# Patient Record
Sex: Female | Born: 1995 | Race: White | Hispanic: No | Marital: Single | Smoking: Never smoker
Health system: Southern US, Community
[De-identification: ages and names within clinical notes are randomized; demographics above are authoritative.]

---
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
## 2015-02-07 ENCOUNTER — Encounter: Payer: Self-pay | Admitting: Emergency Medicine

## 2015-02-07 ENCOUNTER — Ambulatory Visit
Admission: EM | Admit: 2015-02-07 | Discharge: 2015-02-07 | Disposition: A | Discharge status: Home / Self Care | Payer: Managed Care, Other (non HMO) | Attending: Family Medicine | Admitting: Family Medicine

## 2015-02-07 DIAGNOSIS — J069 Acute upper respiratory infection, unspecified: Secondary | ICD-10-CM

## 2015-02-07 MED ORDER — AZITHROMYCIN 250 MG PO TABS: ORAL_TABLET | ORAL | Status: AC

## 2015-02-07 NOTE — Discharge Instructions (Signed)
Upper Respiratory Infection, Adult An upper respiratory infection (URI) is also sometimes known as the common cold. The upper respiratory tract includes the nose, sinuses, throat, trachea, and bronchi. Bronchi are the airways leading to the lungs. Most people improve within 1 week, but symptoms can last up to 2 weeks. A residual cough may last even longer.  CAUSES Many different viruses can infect the tissues lining the upper respiratory tract. The tissues become irritated and inflamed and often become very moist. Mucus production is also common. A cold is contagious. You can easily spread the virus to others by oral contact. This includes kissing, sharing a glass, coughing, or sneezing. Touching your mouth or nose and then touching a surface, which is then touched by another person, can also spread the virus. SYMPTOMS  Symptoms typically develop 1 to 3 days after you come in contact with a cold virus. Symptoms vary from person to person. They may include:  Runny nose.  Sneezing.  Nasal congestion.  Sinus irritation.  Sore throat.  Loss of voice (laryngitis).  Cough.  Fatigue.  Muscle aches.  Loss of appetite.  Headache.  Low-grade fever. DIAGNOSIS  You might diagnose your own cold based on familiar symptoms, since most people get a cold 2 to 3 times a year. Your caregiver can confirm this based on your exam. Most importantly, your caregiver can check that your symptoms are not due to another disease such as strep throat, sinusitis, pneumonia, asthma, or epiglottitis. Blood tests, throat tests, and X-rays are not necessary to diagnose a common cold, but they may sometimes be helpful in excluding other more serious diseases. Your caregiver will decide if any further tests are required. RISKS AND COMPLICATIONS  You may be at risk for a more severe case of the common cold if you smoke cigarettes, have chronic heart disease (such as heart failure) or lung disease (such as asthma), or if  you have a weakened immune system. The very young and very old are also at risk for more serious infections. Bacterial sinusitis, middle ear infections, and bacterial pneumonia can complicate the common cold. The common cold can worsen asthma and chronic obstructive pulmonary disease (COPD). Sometimes, these complications can require emergency medical care and may be life-threatening. PREVENTION  The best way to protect against getting a cold is to practice good hygiene. Avoid oral or hand contact with people with cold symptoms. Wash your hands often if contact occurs. There is no clear evidence that vitamin C, vitamin E, echinacea, or exercise reduces the chance of developing a cold. However, it is always recommended to get plenty of rest and practice good nutrition. TREATMENT  Treatment is directed at relieving symptoms. There is no cure. Antibiotics are not effective, because the infection is caused by a virus, not by bacteria. Treatment may include:  Increased fluid intake. Sports drinks offer valuable electrolytes, sugars, and fluids.  Breathing heated mist or steam (vaporizer or shower).  Eating chicken soup or other clear broths, and maintaining good nutrition.  Getting plenty of rest.  Using gargles or lozenges for comfort.  Controlling fevers with ibuprofen or acetaminophen as directed by your caregiver.  Increasing usage of your inhaler if you have asthma. Zinc gel and zinc lozenges, taken in the first 24 hours of the common cold, can shorten the duration and lessen the severity of symptoms. Pain medicines may help with fever, muscle aches, and throat pain. A variety of non-prescription medicines are available to treat congestion and runny nose. Your caregiver   can make recommendations and may suggest nasal or lung inhalers for other symptoms.  HOME CARE INSTRUCTIONS   Only take over-the-counter or prescription medicines for pain, discomfort, or fever as directed by your  caregiver.  Use a warm mist humidifier or inhale steam from a shower to increase air moisture. This may keep secretions moist and make it easier to breathe.  Drink enough water and fluids to keep your urine clear or pale yellow.  Rest as needed.  Return to work when your temperature has returned to normal or as your caregiver advises. You may need to stay home longer to avoid infecting others. You can also use a face mask and careful hand washing to prevent spread of the virus. SEEK MEDICAL CARE IF:   After the first few days, you feel you are getting worse rather than better.  You need your caregiver's advice about medicines to control symptoms.  You develop chills, worsening shortness of breath, or brown or red sputum. These may be signs of pneumonia.  You develop yellow or brown nasal discharge or pain in the face, especially when you bend forward. These may be signs of sinusitis.  You develop a fever, swollen neck glands, pain with swallowing, or white areas in the back of your throat. These may be signs of strep throat. SEEK IMMEDIATE MEDICAL CARE IF:   You have a fever.  You develop severe or persistent headache, ear pain, sinus pain, or chest pain.  You develop wheezing, a prolonged cough, cough up blood, or have a change in your usual mucus (if you have chronic lung disease).  You develop sore muscles or a stiff neck. Document Released: 10/23/2000 Document Revised: 07/22/2011 Document Reviewed: 08/04/2013 Exit[employer] Patient Information 2015 [employer]. This information is not intended to replace advice given to you by your health care provider. Make sure you discuss any questions you have with your health care provider.  

## 2015-02-07 NOTE — ED Notes (Signed)
Pt with uri s/s x 1 week

## 2015-02-07 NOTE — ED Provider Notes (Signed)
CSN: [id number]     Arrival date & time 02/07/15  1043 History   First MD Initiated Contact with Patient 02/07/15 1141     Chief Complaint  Patient presents with  . 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 She states that the symptoms worsened significantly  after Saturday. She did having sputum and mucus along with chills and fever. Her sinus seems be her worse problem, is likely causing her cough. She has been using NyQuil and over-the-counter medications which have not been helpful.  History reviewed. No pertinent past medical history. History reviewed. No pertinent past surgical history. History reviewed. No pertinent family history. Social History  Substance Use Topics  . Smoking status: Never Smoker   . Smokeless tobacco: None  . Alcohol Use: Yes   OB History    No data available     Review of Systems  Constitutional: Positive for fever, chills and fatigue.  HENT: Positive for congestion, postnasal drip, rhinorrhea, sinus pressure, sneezing and sore throat.   Eyes: Negative for pain, discharge and itching.  Respiratory: Positive for cough.   All other systems reviewed and are negative.   Allergies  Dye fdc red  Home Medications   Prior to Admission medications   Medication Sig Start Date End Date Taking? Authorizing Provider  norgestimate-ethinyl estradiol (ORTHO-CYCLEN,SPRINTEC,PREVIFEM) 0.25-35 MG-MCG tablet Take 1 tablet by mouth daily.   Yes Historical Provider, MD  azithromycin (ZITHROMAX Z-PAK) 250 MG tablet Use as per package instructions 02/07/15   Lutricia Feil, PA-C   Meds Ordered and Administered this Visit  Medications - No data to display  BP 126/87 mmHg  Pulse 88  Temp(Src) 97.5 F (36.4 C) (Tympanic)  Resp 20  Ht 5' 4.5" (1.638 m)  Wt 125 lb (56.7 kg)   BMI 21.13 kg/m2  SpO2 100%  LMP 01/20/2015 No data found.   Physical Exam  Constitutional: She is oriented to person, place, and time. She appears well-developed and well-nourished. No distress.  HENT:  Head: Normocephalic and atraumatic.  Right Ear: External ear normal.  Left Ear: External ear normal.  Mouth/Throat: Oropharyngeal exudate present.  Tenderness to percussion over the frontal and maxillary sinuses  Eyes: Pupils are equal, round, and reactive to light. Right eye exhibits no discharge. Left eye exhibits no discharge.  Neck: Neck supple.  Pulmonary/Chest: Effort normal and breath sounds normal. No stridor. No respiratory distress. She has no wheezes. She has no rales.  Musculoskeletal: Normal range of motion. She exhibits no edema or tenderness.  Lymphadenopathy:    She has no cervical adenopathy.  Neurological: She is alert and oriented to person, place, and time.  Skin: Skin is warm and dry. She is not diaphoretic.  Psychiatric: She has a normal mood and affect. Her behavior is normal. Judgment and thought content normal.  Nursing note and vitals reviewed.   ED Course  Procedures (including critical care time)  Labs Review Labs Reviewed - No data to display  Imaging Review No results found.   Visual Acuity Review  Right Eye Distance:   Left Eye Distance:   Bilateral Distance:    Right Eye Near:   Left Eye Near:    Bilateral Near:         MDM   1. Acute URI    Discharge Medication List as of 02/07/2015 12:03 PM    START  taking these medications   Details  azithromycin (ZITHROMAX Z-PAK) 250 MG tablet Use as per package instructions, Print      Plan: 1. Test/x-ray results and diagnosis reviewed with patient 2. rx as per orders; risks, benefits, potential side effects reviewed with patient 3. Recommend supportive treatment with rest,fluids flonase 4. F/u prn if symptoms worsen or don't improve     Lutricia Feil, PA-C 02/07/15 1208

## 2015-02-10 ENCOUNTER — Encounter: Payer: Self-pay | Admitting: Emergency Medicine

## 2015-02-10 ENCOUNTER — Ambulatory Visit
Admission: EM | Admit: 2015-02-10 | Discharge: 2015-02-10 | Disposition: A | Discharge status: Home / Self Care | Payer: Managed Care, Other (non HMO) | Attending: Family Medicine | Admitting: Family Medicine

## 2015-02-10 DIAGNOSIS — J029 Acute pharyngitis, unspecified: Secondary | ICD-10-CM | POA: Diagnosis not present

## 2015-02-10 DIAGNOSIS — H6593 Unspecified nonsuppurative otitis media, bilateral: Secondary | ICD-10-CM | POA: Diagnosis not present

## 2015-02-10 DIAGNOSIS — J019 Acute sinusitis, unspecified: Secondary | ICD-10-CM

## 2015-02-10 DIAGNOSIS — H6981 Other specified disorders of Eustachian tube, right ear: Secondary | ICD-10-CM

## 2015-02-10 DIAGNOSIS — J45990 Exercise induced bronchospasm: Secondary | ICD-10-CM

## 2015-02-10 DIAGNOSIS — H6991 Unspecified Eustachian tube disorder, right ear: Secondary | ICD-10-CM

## 2015-02-10 LAB — RAPID STREP SCREEN (MED CTR MEBANE ONLY): Streptococcus, Group A Screen (Direct): NEGATIVE

## 2015-02-10 MED ORDER — AMOXICILLIN-POT CLAVULANATE 875-125 MG PO TABS: 1.0000 | ORAL_TABLET | Freq: Two times a day (BID) | ORAL | Status: AC

## 2015-02-10 MED ORDER — FIRST-DUKES MOUTHWASH MT SUSP: 15.0000 mL | Freq: Three times a day (TID) | OROMUCOSAL | Status: AC | PRN

## 2015-02-10 MED ORDER — IBUPROFEN 800 MG PO TABS: 800.0000 mg | ORAL_TABLET | Freq: Three times a day (TID) | ORAL | Status: AC

## 2015-02-10 MED ORDER — SALINE SPRAY 0.65 % NA SOLN: 2.0000 | NASAL | Status: AC

## 2015-02-10 MED ORDER — ALBUTEROL SULFATE HFA 108 (90 BASE) MCG/ACT IN AERS
1.0000 | INHALATION_SPRAY | RESPIRATORY_TRACT | Status: AC | PRN
Start: 2015-02-10 — End: ?

## 2015-02-10 NOTE — ED Provider Notes (Addendum)
CSN: [id number]     Arrival date & time 02/10/15  4098 History   First MD Initiated Contact with Patient 02/10/15 1030     Chief Complaint  Patient presents with  . 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  Now right ear/throat pain and pain with swallowing.  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  Here with mother who is visiting for parents weekend.  Productive green cough.  Roommates also sick.  Sinus pressure has decreased.  Patient is a 19 y.o. female presenting with pharyngitis. The history is provided by the patient and a parent.  Sore Throat This is a new problem. The current episode started 2 days ago. The problem occurs constantly. The problem has been gradually worsening. Pertinent negatives include no chest pain, no abdominal pain, no headaches and no shortness of breath. The symptoms are aggravated by eating, swallowing, drinking and coughing. Nothing relieves the symptoms. She has tried rest, water and food for the symptoms. The treatment provided no relief.    History reviewed. No pertinent past medical history. History reviewed. No pertinent past surgical history. History reviewed. No pertinent family history. Social History  Substance Use Topics  . Smoking status: Never Smoker   . Smokeless tobacco: None  . Alcohol Use: Yes   OB History    No data available     Review of Systems  Constitutional: Positive for fever, chills, activity change, appetite change and fatigue. Negative for diaphoresis and unexpected weight change.  HENT: Positive for congestion, ear pain, postnasal drip, sinus pressure and sore throat. Negative for dental problem, drooling, ear discharge, facial swelling, hearing loss, mouth sores, nosebleeds, rhinorrhea, sneezing, tinnitus, trouble  swallowing and voice change.   Eyes: Negative for photophobia, pain, discharge, redness, itching and visual disturbance.  Respiratory: Positive for cough. Negative for choking, chest tightness, shortness of breath, wheezing and stridor.   Cardiovascular: Negative for chest pain, palpitations and leg swelling.  Gastrointestinal: Negative for nausea, vomiting, abdominal pain, diarrhea, constipation, blood in stool and abdominal distention.  Endocrine: Positive for cold intolerance. Negative for heat intolerance.  Genitourinary: Negative for dysuria.  Musculoskeletal: Negative for myalgias, back pain, joint swelling, arthralgias, gait problem, neck pain and neck stiffness.  Skin: Negative for color change, pallor, rash and wound.  Allergic/Immunologic: Positive for environmental allergies. Negative for food allergies.  Neurological: Negative for dizziness, tremors, seizures, syncope, facial asymmetry, speech difficulty, weakness, light-headedness, numbness and headaches.  Hematological: Negative for adenopathy. Does not bruise/bleed easily.  Psychiatric/Behavioral: Positive for sleep disturbance. Negative for behavioral problems, confusion and agitation.    Allergies  Dye fdc red  Home Medications   Prior to Admission medications   Medication Sig Start Date End Date Taking? Authorizing Provider  albuterol (PROVENTIL HFA;VENTOLIN HFA) 108 (90 BASE) MCG/ACT inhaler Inhale 1-2 puffs into the lungs every 4 (four) hours as needed for wheezing or shortness of breath. 02/10/15   Barbaraann Barthel, NP  amoxicillin-clavulanate (AUGMENTIN) 875-125 MG tablet Take 1 tablet by mouth every 12 (twelve) hours. 02/10/15   Barbaraann Barthel, NP  azithromycin (ZITHROMAX Z-PAK) 250 MG tablet Use as per package instructions 02/07/15   Lutricia Feil, PA-C  Diphenhyd-Hydrocort-Nystatin (FIRST-DUKES MOUTHWASH) SUSP Use as directed 15 mLs in the mouth or throat 3 (three) times daily as needed. 02/10/15   Barbaraann Barthel, NP  ibuprofen (ADVIL,MOTRIN) 800 MG tablet Take 1  tablet (800 mg total) by mouth 3 (three) times daily. 02/10/15   Barbaraann Barthel, NP  norgestimate-ethinyl estradiol (ORTHO-CYCLEN,SPRINTEC,PREVIFEM) 0.25-35 MG-MCG tablet Take 1 tablet by mouth daily.    Historical Provider, MD  sodium chloride (OCEAN) 0.65 % SOLN nasal spray Place 2 sprays into both nostrils every 2 (two) hours while awake. 02/10/15   Barbaraann Barthel, NP   Meds Ordered and Administered this Visit  Medications - No data to display  BP 125/85 mmHg  Pulse 95  Temp(Src) 100.2 F (37.9 C) (Tympanic)  Resp 16  Ht  (1.6 m)  Wt 120 lb (54.432 kg)  BMI 21.26 kg/m2  SpO2 97%  LMP 01/20/2015 No data found.   Physical Exam  Constitutional: She is oriented to person, place, and time. Vital signs are normal. She appears well-developed and well-nourished. No distress.  HENT:  Head: Normocephalic and atraumatic.  Right Ear: Hearing, external ear and ear canal normal. A middle ear effusion is present.  Left Ear: Hearing, external ear and ear canal normal. A middle ear effusion is present.  Nose: Mucosal edema and rhinorrhea present. No nose lacerations, sinus tenderness, nasal deformity, septal deviation or nasal septal hematoma. No epistaxis.  No foreign bodies. Right sinus exhibits no maxillary sinus tenderness and no frontal sinus tenderness. Left sinus exhibits no maxillary sinus tenderness and no frontal sinus tenderness.  Mouth/Throat: Uvula is midline and mucous membranes are normal. Mucous membranes are not pale, not dry and not cyanotic. She does not have dentures. No oral lesions. No trismus in the jaw. Normal dentition. No dental abscesses, uvula swelling, lacerations or dental caries. Posterior oropharyngeal edema and posterior oropharyngeal erythema present. No oropharyngeal exudate or tonsillar abscesses.  Cobblestoning posterior pharynx; bilateral TMs with air fluid level right greater than left slight  opacity right  Eyes: Conjunctivae, EOM and lids are normal. Pupils are equal, round, and reactive to light. Right eye exhibits no discharge. Left eye exhibits no discharge. No scleral icterus.  Neck: Trachea normal and normal range of motion. Neck supple. No tracheal deviation present. No thyromegaly present.  Cardiovascular: Normal rate, regular rhythm, normal heart sounds and intact distal pulses.  Exam reveals no gallop and no friction rub.   No murmur heard. Pulmonary/Chest: Effort normal. No accessory muscle usage or stridor. No respiratory distress. She has no decreased breath sounds. She has wheezes in the right upper field and the left upper field. She has no rhonchi. She has no rales. She exhibits no tenderness.  Abdominal: Soft. Bowel sounds are normal. She exhibits no distension and no mass. There is no tenderness. There is no rebound and no guarding.  Musculoskeletal: Normal range of motion. She exhibits no edema or tenderness.  Lymphadenopathy:    She has no cervical adenopathy.  Neurological: She is alert and oriented to person, place, and time. She exhibits normal muscle tone. Coordination normal.  Skin: Skin is warm, dry and intact. No rash noted. She is not diaphoretic. No erythema. No pallor.  Psychiatric: She has a normal mood and affect. Her speech is normal and behavior is normal. Judgment and thought content normal. Cognition and memory are normal.  Nursing note and vitals reviewed.   ED Course  Procedures (including critical care time)  Labs Review Labs Reviewed  RAPID STREP SCREEN (NOT AT [facility])    Imaging Review No results found.  1211 Discussed rapid strep negative and throat culture pending.  Probable sore throat from sinusitis post nasal drip will not respond to antibiotics  supportive therapy recommended e.g. Flonase, motrin, salt water gargles, honey with lemon.  Patient and mother verbalized understanding of information/instructions and agreed with plan of  care.   MDM   1. Acute pharyngitis, unspecified pharyngitis type   2. Eustachian tube dysfunction, right   3. Otitis media with effusion, bilateral   4. Acute sinusitis, recurrence not specified, unspecified location   5. Exercise-induced asthma with acute exacerbation   School/work excuse note given to patient for 24 hours.  Rapid strep negative.  Will call with throat culture once results available typically 48 hours.  Paper Rx for augmentin  po BID x 10 days given to patient if throat culture positive or worsening sinus pain/pressure to start.  Dukes mouthwash 15ml po gargle and swallow TID prn pain.  OTC NSAID of choic e.g. Motrin  po TID prn.  Usually no specific medical treatment is needed if a virus is causing the sore throat.  The throat most often gets better on its own within 5 to 7 days.  Antibiotic medicine does not cure viral pharyngitis.   For acute pharyngitis caused by bacteria, your healthcare provider will prescribe an antibiotic.  Marland Kitchen Do not smoke.  Marland Kitchen Avoid secondhand smoke and other air pollutants.  . Use a cool mist humidifier to add moisture to the air.  . Get plenty of rest.  . You may want to rest your throat by talking less and eating a diet that is mostly liquid or soft for a day or two.   Marland Kitchen Nonprescription throat lozenges and mouthwashes should help relieve the soreness.   . Gargling with warm saltwater and drinking warm liquids may help.  (You can make a saltwater solution by adding 1/4 teaspoon of salt to 8 ounces, or 240 mL, of warm water.)  . A nonprescription pain reliever such as aspirin, acetaminophen, or ibuprofen may ease general aches and pains.   FOLLOW UP with clinic provider if no improvements in the next 7-10 days.  Patient verbalized understanding of instructions and agreed with plan of care. P2:  Hand washing and diet.  Supportive treatment.   No evidence of invasive bacterial infection, non toxic and well hydrated.  This is most likely self  limiting viral infection.  I do not see where any further testing or imaging is necessary at this time.   I will suggest supportive care, rest, good hygiene and encourage the patient to take adequate fluids.  The patient is to return to clinic or EMERGENCY ROOM if symptoms worsen or change significantly e.g. ear pain, fever, purulent discharge from ears or bleeding.  Exit[employer] handout on otitis media with effusion given to patient.  Patient verbalized agreement and understanding of treatment plan.    Does not feel like when she needs steroids right now.  If worsening to call me and will give her 5 day Rx for prednisone  po daily x 5 days.  Medications as directed.  Patient is to return to the clinic or follow up with PCM if there is increased wheezing or shortness of breath, increased use of albuterol.  Patient educated on the importance of continuing to monitor their peak flows.  Patient given [employer] handout on asthma exacerbation prevention.  Patient verbalized agreement and understanding of treatment plan.  P2:  Asthma action plan, fluids, and fitness  Continue flonase 2 sprays each nostril daily.  Add nasal saline 2 sprays each nostril q2h while awake prn congestion.  Finish azithromycin.  Given augmentin  po BID x 10  days paper Rx in case worsening symptoms to start new Rx.  Discussed use back up birth control while on antibiotics as may counteract birth control  Patient denied sexual activity.  No evidence of systemic bacterial infection, non toxic and well hydrated.  I do not see where any further testing or imaging is necessary at this time.   I will suggest supportive care, rest, good hygiene and encourage the patient to take adequate fluids.  The patient is to return to clinic or EMERGENCY ROOM if symptoms worsen or change significantly.  Exit[employer] handout on sinusitis given to patient.  Patient verbalized agreement and understanding of treatment plan and had no further questions at this time.    P2:  Hand washing and cover cough    Barbaraann Barthel, NP 02/10/15 1308  Telephone message left for patient throat culture normal/negative for strep.  Requested patient to contact clinic for symptoms/status update.  13 Feb 2015 at 1444  Barbaraann Barthel, NP 02/13/15 1444

## 2015-02-10 NOTE — Discharge Instructions (Signed)
Asthma Attack Prevention Although there is no way to prevent asthma from starting, you can take steps to control the disease and reduce its symptoms. Learn about your asthma and how to control it. Take an active role to control your asthma by working with your health care provider to create and follow an asthma action plan. An asthma action plan guides you in:  Taking your medicines properly.  Avoiding things that set off your asthma or make your asthma worse (asthma triggers).  Tracking your level of asthma control.  Responding to worsening asthma.  Seeking emergency care when needed. To track your asthma, keep records of your symptoms, check your peak flow number using a handheld device that shows how well air moves out of your lungs (peak flow meter), and get regular asthma checkups.  WHAT ARE SOME WAYS TO PREVENT AN ASTHMA ATTACK?  Take medicines as directed by your health care provider.  Keep track of your asthma symptoms and level of control.  With your health care provider, write a detailed plan for taking medicines and managing an asthma attack. Then be sure to follow your action plan. Asthma is an ongoing condition that needs regular monitoring and treatment.  Identify and avoid asthma triggers. Many outdoor allergens and irritants (such as pollen, mold, cold air, and air pollution) can trigger asthma attacks. Find out what your asthma triggers are and take steps to avoid them.  Monitor your breathing. Learn to recognize warning signs of an attack, such as coughing, wheezing, or shortness of breath. Your lung function may decrease before you notice any signs or symptoms, so regularly measure and record your peak airflow with a home peak flow meter.  Identify and treat attacks early. If you act quickly, you are less likely to have a severe attack. You will also need less medicine to control your symptoms. When your peak flow measurements decrease and alert you to an upcoming attack,  take your medicine as instructed and immediately stop any activity that may have triggered the attack. If your symptoms do not improve, get medical help.  Pay attention to increasing quick-relief inhaler use. If you find yourself relying on your quick-relief inhaler, your asthma is not under control. See your health care provider about adjusting your treatment. WHAT CAN MAKE MY SYMPTOMS WORSE? A number of common things can set off or make your asthma symptoms worse and cause temporary increased inflammation of your airways. Keep track of your asthma symptoms for several weeks, detailing all the environmental and emotional factors that are linked with your asthma. When you have an asthma attack, go back to your asthma diary to see which factor, or combination of factors, might have contributed to it. Once you know what these factors are, you can take steps to control many of them. If you have allergies and asthma, it is important to take asthma prevention steps at home. Minimizing contact with the substance to which you are allergic will help prevent an asthma attack. Some triggers and ways to avoid these triggers are: Animal Dander:  Some people are allergic to the flakes of skin or dried saliva from animals with fur or feathers.   There is no such thing as a hypoallergenic dog or cat breed. All dogs or cats can cause allergies, even if they don't shed.  Keep these pets out of your home.  If you are not able to keep a pet outdoors, keep the pet out of your bedroom and other sleeping areas at all  times, and keep the door closed. °· Remove carpets and furniture covered with cloth from your home. If that is not possible, keep the pet away from fabric-covered furniture and carpets. °Dust Mites: °Many people with asthma are allergic to dust mites. Dust mites are tiny bugs that are found in every home in mattresses, pillows, carpets, fabric-covered furniture, bedcovers, clothes, stuffed toys, and other  fabric-covered items.  °· Cover your mattress in a special dust-proof cover. °· Cover your pillow in a special dust-proof cover, or wash the pillow each week in hot water. Water must be hotter than 130° F (54.4° C) to kill dust mites. Cold or warm water used with detergent and bleach can also be effective. °· Wash the sheets and blankets on your bed each week in hot water. °· Try not to sleep or lie on cloth-covered cushions. °· Call ahead when traveling and ask for a smoke-free hotel room. Bring your own bedding and pillows in case the hotel only supplies feather pillows and down comforters, which may contain dust mites and cause asthma symptoms. °· Remove carpets from your bedroom and those laid on concrete, if you can. °· Keep stuffed toys out of the bed, or wash the toys weekly in hot water or cooler water with detergent and bleach. °Cockroaches: °Many people with asthma are allergic to the droppings and remains of cockroaches.  °· Keep food and garbage in closed containers. Never leave food out. °· Use poison baits, traps, powders, gels, or paste (for example, boric acid). °· If a spray is used to kill cockroaches, stay out of the room until the odor goes away. °Indoor Mold: °· Fix leaky faucets, pipes, or other sources of water that have mold around them. °· Clean floors and moldy surfaces with a fungicide or diluted bleach. °· Avoid using humidifiers, vaporizers, or swamp coolers. These can spread molds through the air. °Pollen and Outdoor Mold: °· When pollen or mold spore counts are high, try to keep your windows closed. °· Stay indoors with windows closed from late morning to afternoon. Pollen and some mold spore counts are highest at that time. °· Ask your health care provider whether you need to take anti-inflammatory medicine or increase your dose of the medicine before your allergy season starts. °Other Irritants to Avoid: °· Tobacco smoke is an irritant. If you smoke, ask your health care provider how  you can quit. Ask family members to quit smoking, too. Do not allow smoking in your home or car. °· If possible, do not use a wood-burning stove, kerosene heater, or fireplace. Minimize exposure to all sources of smoke, including incense, candles, fires, and fireworks. °· Try to stay away from strong odors and sprays, such as perfume, talcum powder, hair spray, and paints. °· Decrease humidity in your home and use an indoor air cleaning device. Reduce indoor humidity to below 60%. Dehumidifiers or central air conditioners can do this. °· Decrease house dust exposure by changing furnace and air cooler filters frequently. °· Try to have someone else vacuum for you once or twice a week. Stay out of rooms while they are being vacuumed and for a short while afterward. °· If you vacuum, use a dust mask from a hardware store, a double-layered or microfilter vacuum cleaner bag, or a vacuum cleaner with a HEPA filter. °· Sulfites in foods and beverages can be irritants. Do not drink beer or wine or eat dried fruit, processed potatoes, or shrimp if they cause asthma symptoms. °· Cold   air can trigger an asthma attack. Cover your nose and mouth with a scarf on cold or windy days.  Several health conditions can make asthma more difficult to manage, including a runny nose, sinus infections, reflux disease, psychological stress, and sleep apnea. Work with your health care provider to manage these conditions.  Avoid close contact with people who have a respiratory infection such as a cold or the flu, since your asthma symptoms may get worse if you catch the infection. Wash your hands thoroughly after touching items that may have been handled by people with a respiratory infection.  Get a flu shot every year to protect against the flu virus, which often makes asthma worse for days or weeks. Also get a pneumonia shot if you have not previously had one. Unlike the flu shot, the pneumonia shot does not need to be given  yearly. Medicines:  Talk to your health care provider about whether it is safe for you to take aspirin or non-steroidal anti-inflammatory medicines (NSAIDs). In a small number of people with asthma, aspirin and NSAIDs can cause asthma attacks. These medicines must be avoided by people who have known aspirin-sensitive asthma. It is important that people with aspirin-sensitive asthma read labels of all over-the-counter medicines used to treat pain, colds, coughs, and fever.  Beta-blockers and ACE inhibitors are other medicines you should discuss with your health care provider. HOW CAN I FIND OUT WHAT I AM ALLERGIC TO? Ask your asthma health care provider about allergy skin testing or blood testing (the RAST test) to identify the allergens to which you are sensitive. If you are found to have allergies, the most important thing to do is to try to avoid exposure to any allergens that you are sensitive to as much as possible. Other treatments for allergies, such as medicines and allergy shots (immunotherapy) are available.  CAN I EXERCISE? Follow your health care provider's advice regarding asthma treatment before exercising. It is important to maintain a regular exercise program, but vigorous exercise or exercise in cold, humid, or dry environments can cause asthma attacks, especially for those people who have exercise-induced asthma. Document Released: 04/17/2009 Document Revised: 05/04/2013 Document Reviewed: 11/04/2012 [facility] Patient Information 2015 [place], [place]. This information is not intended to replace advice given to you by your health care provider. Make sure you discuss any questions you have with your health care provider. Barotitis Media Barotitis media is inflammation of your middle ear. This occurs when the auditory tube (eustachian tube) leading from the back of your nose (nasopharynx) to your eardrum is blocked. This blockage may result from a cold, environmental allergies, or an upper  respiratory infection. Unresolved barotitis media may lead to damage or hearing loss (barotrauma), which may become permanent. HOME CARE INSTRUCTIONS   Use medicines as recommended by your health care provider. Over-the-counter medicines will help unblock the canal and can help during times of air travel.  Do not put anything into your ears to clean or unplug them. Eardrops will not be helpful.  Do not swim, dive, or fly until your health care provider says it is all right to do so. If these activities are necessary, chewing gum with frequent, forceful swallowing may help. It is also helpful to hold your nose and gently blow to pop your ears for equalizing pressure changes. This forces air into the eustachian tube.  Only take over-the-counter or prescription medicines for pain, discomfort, or fever as directed by your health care provider.  A decongestant may be helpful  in decongesting the middle ear and make pressure equalization easier. SEEK MEDICAL CARE IF:  You experience a serious form of dizziness in which you feel as if the room is spinning and you feel nauseated (vertigo).  Your symptoms only involve one ear. SEEK IMMEDIATE MEDICAL CARE IF:   You develop a severe headache, dizziness, or severe ear pain.  You have bloody or pus-like drainage from your ears.  You develop a fever.  Your problems do not improve or become worse. MAKE SURE YOU:   Understand these instructions.  Will watch your condition.  Will get help right away if you are not doing well or get worse. Document Released: 04/26/2000 Document Revised: 02/17/2013 Document Reviewed: 11/24/2012 [facility] Patient Information 2015 [place], [place]. This information is not intended to replace advice given to you by your health care provider. Make sure you discuss any questions you have with your health care provider. Otitis Media With Effusion Otitis media with effusion is the presence of fluid in the middle ear. This is  a common problem in children, which often follows ear infections. It may be present for weeks or longer after the infection. Unlike an acute ear infection, otitis media with effusion refers only to fluid behind the ear drum and not infection. Children with repeated ear and sinus infections and allergy problems are the most likely to get otitis media with effusion. CAUSES  The most frequent cause of the fluid buildup is dysfunction of the eustachian tubes. These are the tubes that drain fluid in the ears to the back of the nose (nasopharynx). SYMPTOMS   The main symptom of this condition is hearing loss. As a result, you or your child may:  Listen to the TV at a loud volume.  Not respond to questions.  Ask "what" often when spoken to.  Mistake or confuse one sound or word for another.  There may be a sensation of fullness or pressure but usually not pain. DIAGNOSIS   Your health care provider will diagnose this condition by examining you or your child's ears.  Your health care provider may test the pressure in you or your child's ear with a tympanometer.  A hearing test may be conducted if the problem persists. TREATMENT   Treatment depends on the duration and the effects of the effusion.  Antibiotics, decongestants, nose drops, and cortisone-type drugs (tablets or nasal spray) may not be helpful.  Children with persistent ear effusions may have delayed language or behavioral problems. Children at risk for developmental delays in hearing, learning, and speech may require referral to a specialist earlier than children not at risk.  You or your child's health care provider may suggest a referral to an ear, nose, and throat surgeon for treatment. The following may help restore normal hearing:  Drainage of fluid.  Placement of ear tubes (tympanostomy tubes).  Removal of adenoids (adenoidectomy). HOME CARE INSTRUCTIONS   Avoid secondhand smoke.  Infants who are breastfed are less  likely to have this condition.  Avoid feeding infants while they are lying flat.  Avoid known environmental allergens.  Avoid people who are sick. SEEK MEDICAL CARE IF:   Hearing is not better in 3 months.  Hearing is worse.  Ear pain.  Drainage from the ear.  Dizziness. MAKE SURE YOU:   Understand these instructions.  Will watch your condition.  Will get help right away if you are not doing well or get worse. Document Released: 06/06/2004 Document Revised: 09/13/2013 Document Reviewed: 11/24/2012 [employer] Patient  Information 2015 [place], [place]. This information is not intended to replace advice given to you by your health care provider. Make sure you discuss any questions you have with your health care provider. Pharyngitis Pharyngitis is redness, pain, and swelling (inflammation) of your pharynx.  CAUSES  Pharyngitis is usually caused by infection. Most of the time, these infections are from viruses (viral) and are part of a cold. However, sometimes pharyngitis is caused by bacteria (bacterial). Pharyngitis can also be caused by allergies. Viral pharyngitis may be spread from person to person by coughing, sneezing, and personal items or utensils (cups, forks, spoons, toothbrushes). Bacterial pharyngitis may be spread from person to person by more intimate contact, such as kissing.  SIGNS AND SYMPTOMS  Symptoms of pharyngitis include:   Sore throat.   Tiredness (fatigue).   Low-grade fever.   Headache.  Joint pain and muscle aches.  Skin rashes.  Swollen lymph nodes.  Plaque-like film on throat or tonsils (often seen with bacterial pharyngitis). DIAGNOSIS  Your health care provider will ask you questions about your illness and your symptoms. Your medical history, along with a physical exam, is often all that is needed to diagnose pharyngitis. Sometimes, a rapid strep test is done. Other lab tests may also be done, depending on the suspected cause.  TREATMENT    Viral pharyngitis will usually get better in 3-4 days without the use of medicine. Bacterial pharyngitis is treated with medicines that kill germs (antibiotics).  HOME CARE INSTRUCTIONS   Drink enough water and fluids to keep your urine clear or pale yellow.   Only take over-the-counter or prescription medicines as directed by your health care provider:   If you are prescribed antibiotics, make sure you finish them even if you start to feel better.   Do not take aspirin.   Get lots of rest.   Gargle with 8 oz of salt water ( tsp of salt per 1 qt of water) as often as every 1-2 hours to soothe your throat.   Throat lozenges (if you are not at risk for choking) or sprays may be used to soothe your throat. SEEK MEDICAL CARE IF:   You have large, tender lumps in your neck.  You have a rash.  You cough up green, yellow-brown, or bloody spit. SEEK IMMEDIATE MEDICAL CARE IF:   Your neck becomes stiff.  You drool or are unable to swallow liquids.  You vomit or are unable to keep medicines or liquids down.  You have severe pain that does not go away with the use of recommended medicines.  You have trouble breathing (not caused by a stuffy nose). MAKE SURE YOU:   Understand these instructions.  Will watch your condition.  Will get help right away if you are not doing well or get worse. Document Released: 04/29/2005 Document Revised: 02/17/2013 Document Reviewed: 01/04/2013 [facility] Patient Information 2015 [place], [place]. This information is not intended to replace advice given to you by your health care provider. Make sure you discuss any questions you have with your health care provider. Sinusitis Sinusitis is redness, soreness, and inflammation of the paranasal sinuses. Paranasal sinuses are air pockets within the bones of your face (beneath the eyes, the middle of the forehead, or above the eyes). In healthy paranasal sinuses, mucus is able to drain out, and air is  able to circulate through them by way of your nose. However, when your paranasal sinuses are inflamed, mucus and air can become trapped. This can allow bacteria and  other germs to grow and cause infection. Sinusitis can develop quickly and last only a short time (acute) or continue over a long period (chronic). Sinusitis that lasts for more than 12 weeks is considered chronic.  CAUSES  Causes of sinusitis include:  Allergies.  Structural abnormalities, such as displacement of the cartilage that separates your nostrils (deviated septum), which can decrease the air flow through your nose and sinuses and affect sinus drainage.  Functional abnormalities, such as when the small hairs (cilia) that line your sinuses and help remove mucus do not work properly or are not present. SIGNS AND SYMPTOMS  Symptoms of acute and chronic sinusitis are the same. The primary symptoms are pain and pressure around the affected sinuses. Other symptoms include:  Upper toothache.  Earache.  Headache.  Bad breath.  Decreased sense of smell and taste.  A cough, which worsens when you are lying flat.  Fatigue.  Fever.  Thick drainage from your nose, which often is green and may contain pus (purulent).  Swelling and warmth over the affected sinuses. DIAGNOSIS  Your health care provider will perform a physical exam. During the exam, your health care provider may:  Look in your nose for signs of abnormal growths in your nostrils (nasal polyps).  Tap over the affected sinus to check for signs of infection.  View the inside of your sinuses (endoscopy) using an imaging device that has a light attached (endoscope). If your health care provider suspects that you have chronic sinusitis, one or more of the following tests may be recommended:  Allergy tests.  Nasal culture. A sample of mucus is taken from your nose, sent to a lab, and screened for bacteria.  Nasal cytology. A sample of mucus is taken from  your nose and examined by your health care provider to determine if your sinusitis is related to an allergy. TREATMENT  Most cases of acute sinusitis are related to a viral infection and will resolve on their own within 10 days. Sometimes medicines are prescribed to help relieve symptoms (pain medicine, decongestants, nasal steroid sprays, or saline sprays).  However, for sinusitis related to a bacterial infection, your health care provider will prescribe antibiotic medicines. These are medicines that will help kill the bacteria causing the infection.  Rarely, sinusitis is caused by a fungal infection. In theses cases, your health care provider will prescribe antifungal medicine. For some cases of chronic sinusitis, surgery is needed. Generally, these are cases in which sinusitis recurs more than 3 times per year, despite other treatments. HOME CARE INSTRUCTIONS   Drink plenty of water. Water helps thin the mucus so your sinuses can drain more easily.  Use a humidifier.  Inhale steam 3 to 4 times a day (for example, sit in the bathroom with the shower running).  Apply a warm, moist washcloth to your face 3 to 4 times a day, or as directed by your health care provider.  Use saline nasal sprays to help moisten and clean your sinuses.  Take medicines only as directed by your health care provider.  If you were prescribed either an antibiotic or antifungal medicine, finish it all even if you start to feel better. SEEK IMMEDIATE MEDICAL CARE IF:  You have increasing pain or severe headaches.  You have nausea, vomiting, or drowsiness.  You have swelling around your face.  You have vision problems.  You have a stiff neck.  You have difficulty breathing. MAKE SURE YOU:   Understand these instructions.  Will watch your  condition.  Will get help right away if you are not doing well or get worse. Document Released: 04/29/2005 Document Revised: 09/13/2013 Document Reviewed:  05/14/2011 [facility] Patient Information 2015 [place], [place]. This information is not intended to replace advice given to you by your health care provider. Make sure you discuss any questions you have with your health care provider.

## 2015-02-10 NOTE — ED Notes (Signed)
Patient c/o sore throat and right ear pain for the past 2 days.  Patient has one day left on her Z-Pack.  Patient was seen here on Tuesday.  Patient report fevers.

## 2015-02-13 LAB — CULTURE, GROUP A STREP (THRC)

## 2015-06-07 IMAGING — CR DG CHEST 2V
1 series · 2 of 2 positions shown · non-contrast
Comparison: None.

CLINICAL DATA: Productive cough and chest pain. History of
pneumonia. Patient short of breath for 1 week.

EXAM:
CHEST  2 VIEW

[Series 1: dxr chest pa (or ap) and lateral · 0.14mm/px · 2 of 2 slices shown]
[im 1/2]
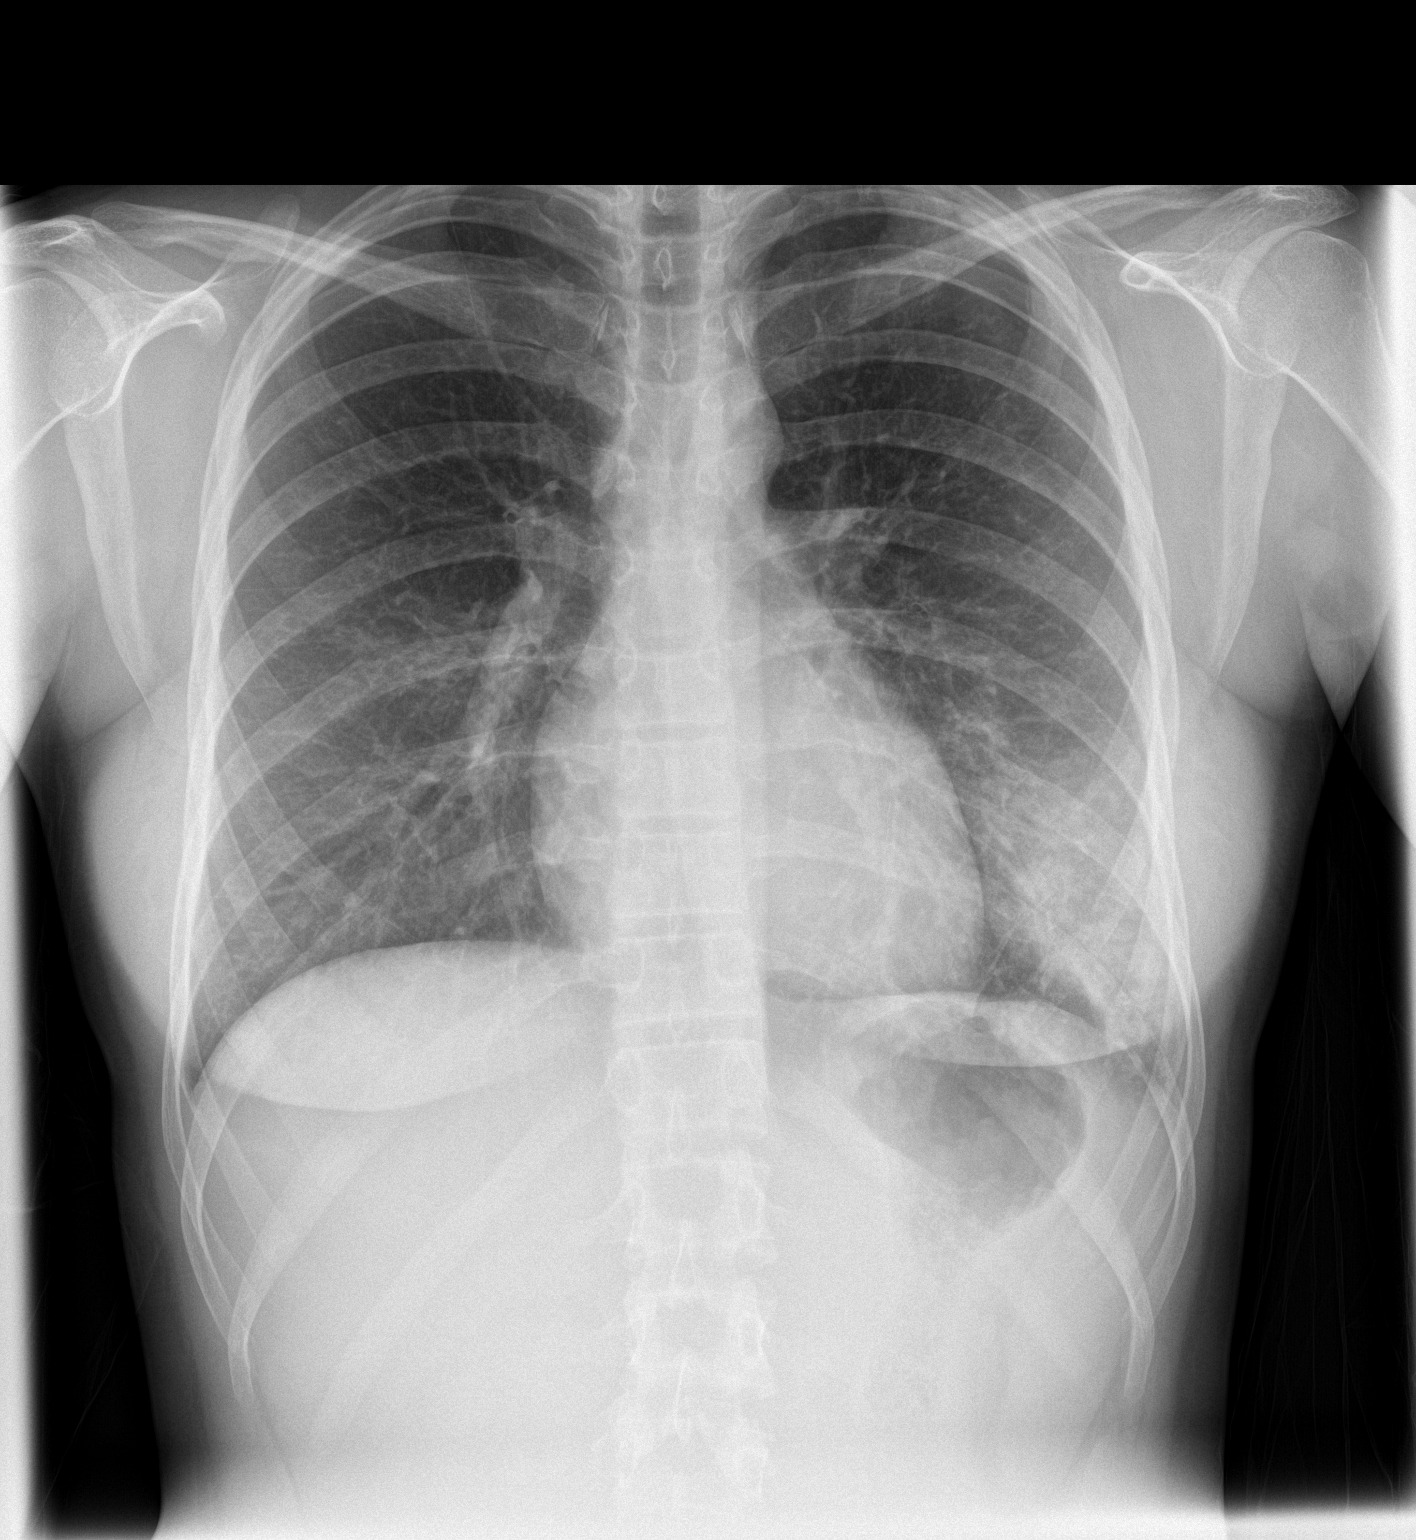
[im 2/2]
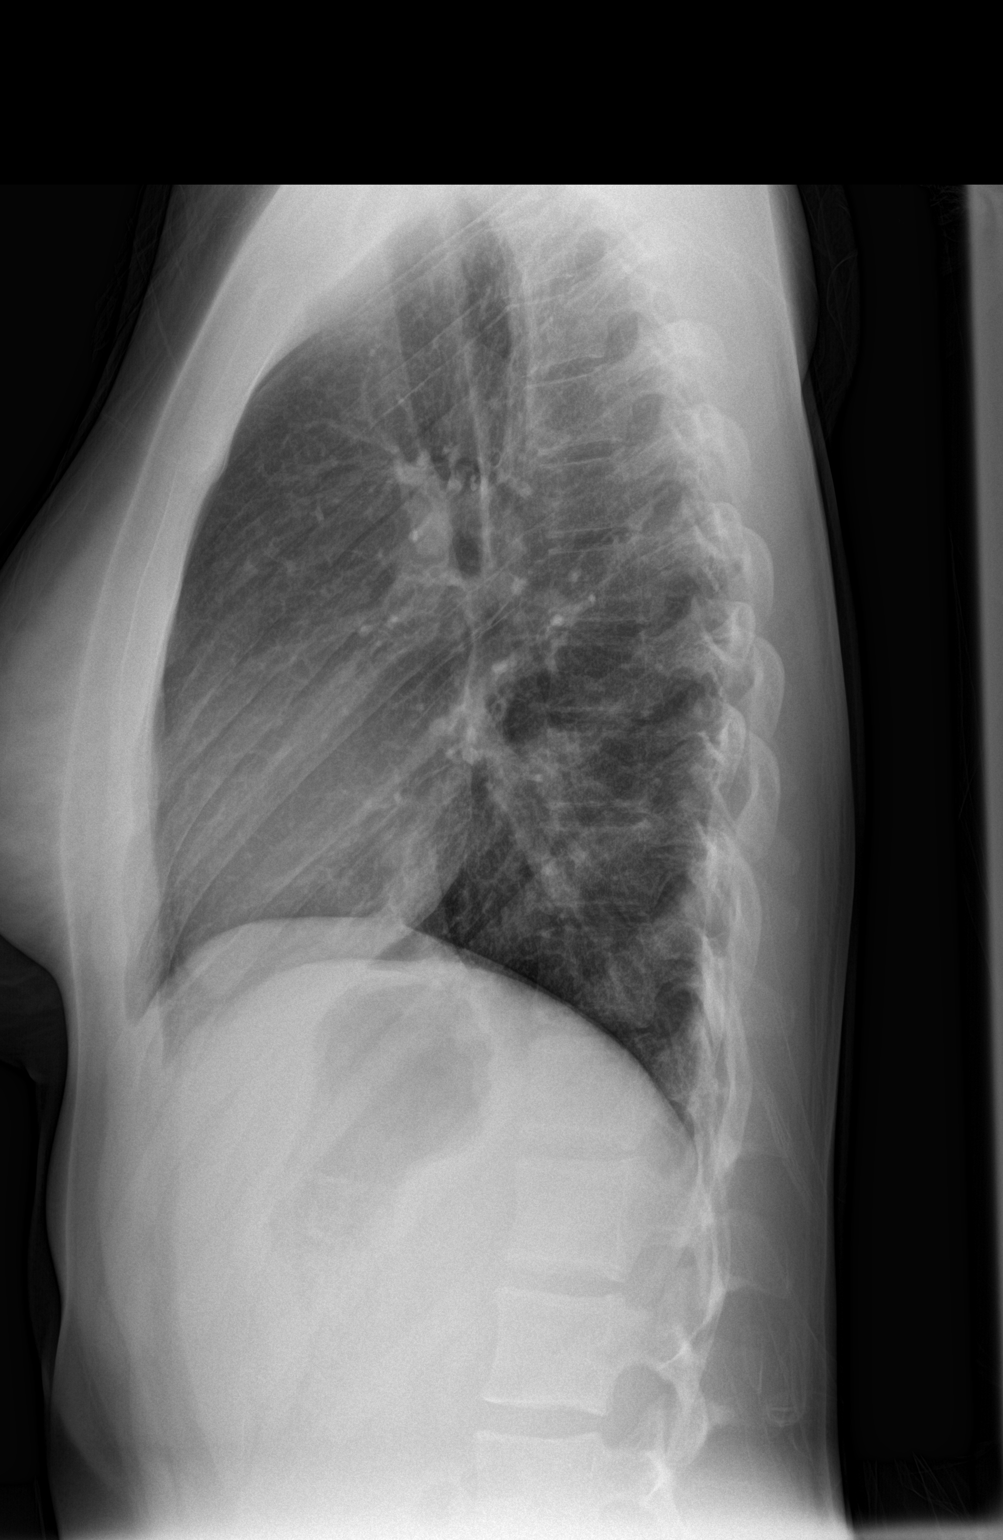

[2 of 2 positions shown; findings below may reference images not displayed]

FINDINGS: Patchy interstitial and airspace opacities are noted in the left
lower lobe consistent with pneumonia.

Lungs are otherwise clear.  No pleural effusion.  No pneumothorax.

Normal heart, mediastinum and hila.  Normal bony thorax.
IMPRESSION: Left lower lobe pneumonia.
